# Patient Record
Sex: Female | Born: 1975 | Race: Black or African American | Hispanic: No | Marital: Married | Smoking: Never smoker
Health system: Southern US, Community
[De-identification: ages and names within clinical notes are randomized; demographics above are authoritative.]

---

## 1999-12-09 ENCOUNTER — Ambulatory Visit (HOSPITAL_COMMUNITY): Admission: RE | Admit: 1999-12-09 | Discharge: 1999-12-09 | Payer: Self-pay | Admitting: *Deleted

## 1999-12-09 ENCOUNTER — Encounter: Payer: Self-pay | Admitting: *Deleted

## 2000-03-09 ENCOUNTER — Inpatient Hospital Stay (HOSPITAL_COMMUNITY): Admission: AD | Admit: 2000-03-09 | Discharge: 2000-03-11 | Payer: Self-pay | Admitting: *Deleted

## 2000-08-16 ENCOUNTER — Encounter: Admission: RE | Admit: 2000-08-16 | Discharge: 2000-08-16 | Payer: Self-pay | Admitting: Obstetrics & Gynecology

## 2003-05-14 ENCOUNTER — Emergency Department (HOSPITAL_COMMUNITY): Admission: EM | Admit: 2003-05-14 | Discharge: 2003-05-14 | Payer: Self-pay | Admitting: Emergency Medicine

## 2004-09-24 ENCOUNTER — Ambulatory Visit (HOSPITAL_COMMUNITY): Admission: RE | Admit: 2004-09-24 | Discharge: 2004-09-24 | Payer: Self-pay | Admitting: *Deleted

## 2005-02-06 ENCOUNTER — Inpatient Hospital Stay (HOSPITAL_COMMUNITY): Admission: AD | Admit: 2005-02-06 | Discharge: 2005-02-08 | Payer: Self-pay | Admitting: Obstetrics & Gynecology

## 2005-02-06 ENCOUNTER — Ambulatory Visit: Payer: Self-pay | Admitting: Obstetrics & Gynecology

## 2006-12-22 IMAGING — US US OB COMP +14 WK
1 series · 13 of 28 positions shown · non-contrast
Comparison: none

CLINICAL DATA: Anatomic exam.  No current problems.

[Series 1: us ob comp +14 wk · 0.29mm/px · 13 of 72 slices shown]
[im 3/72]
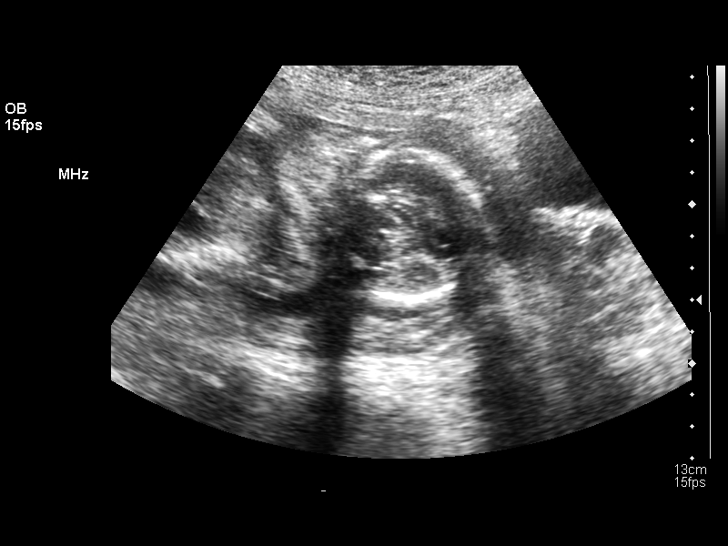
[im 8/72]
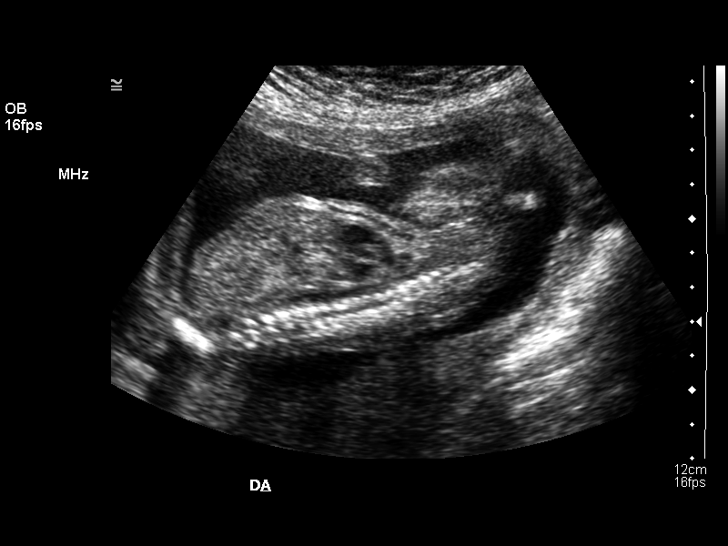
[im 14/72]
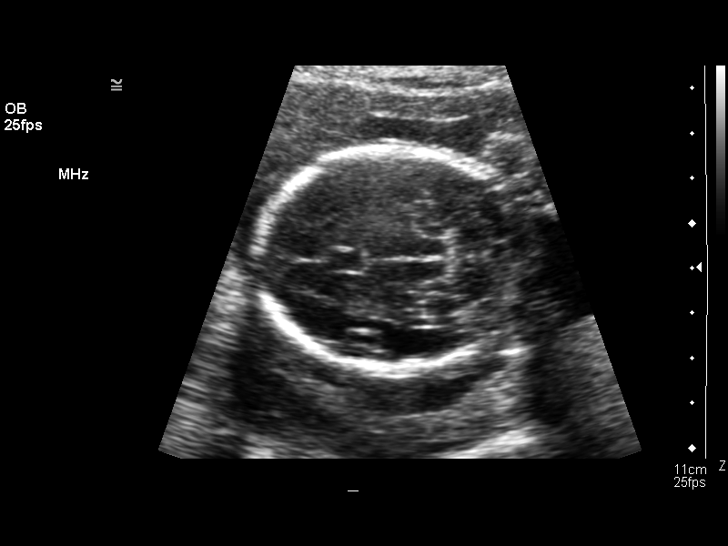
[im 19/72]
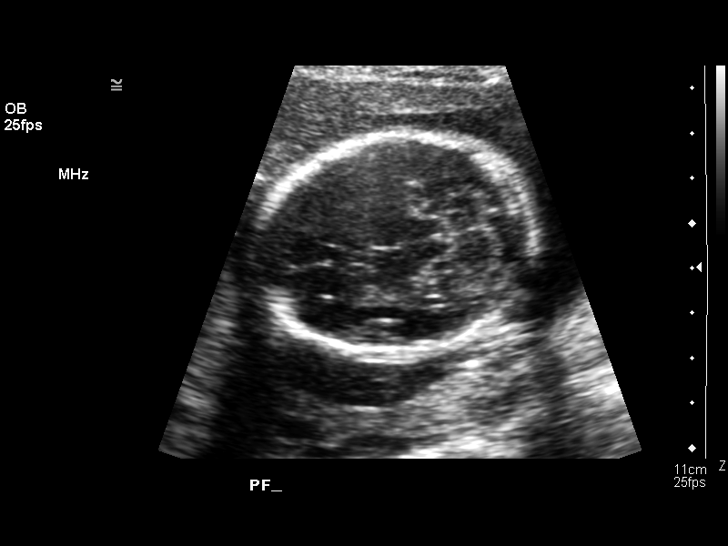
[im 24/72]
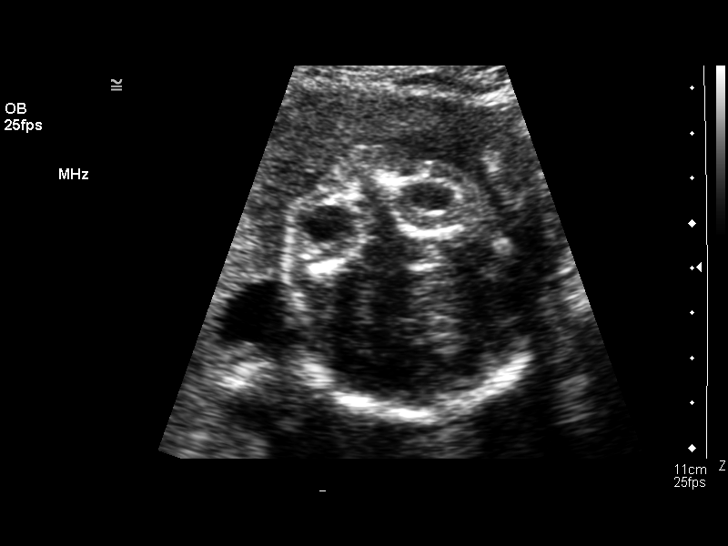
[im 29/72]
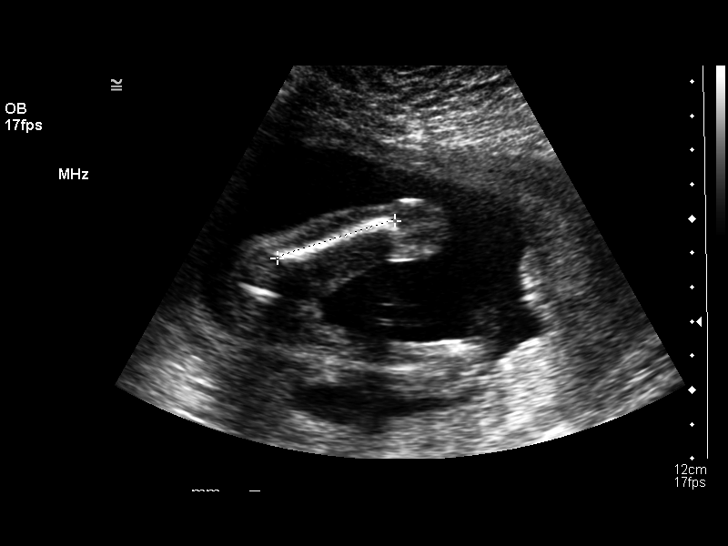
[im 37/72]
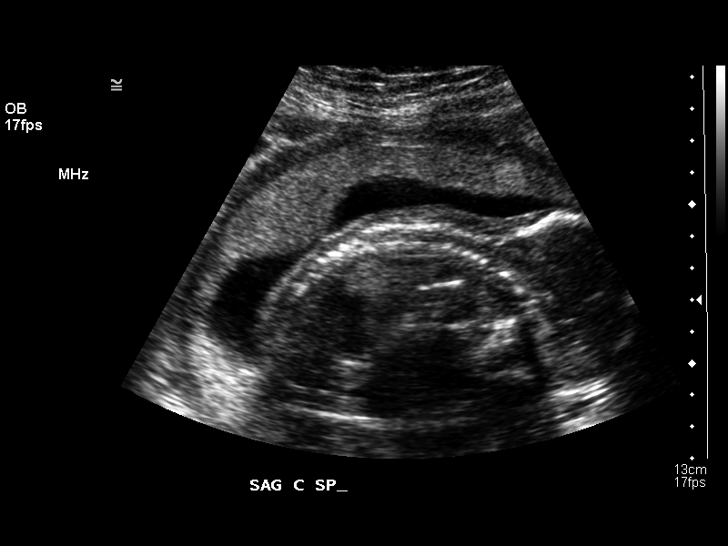
[im 43/72]
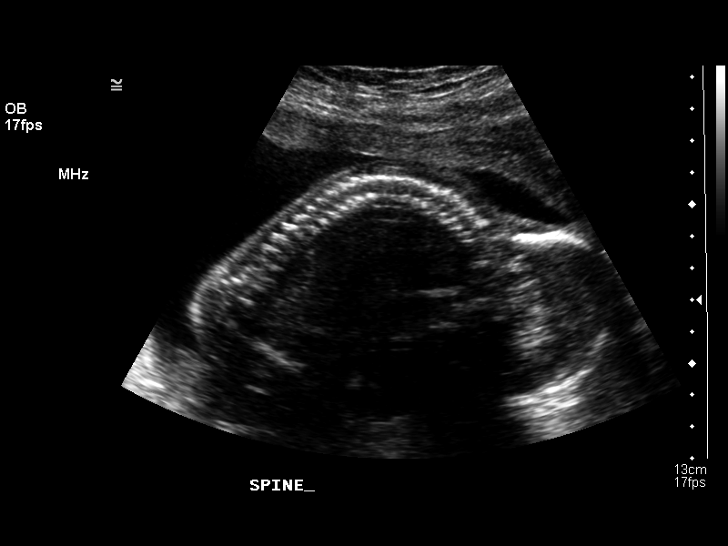
[im 48/72]
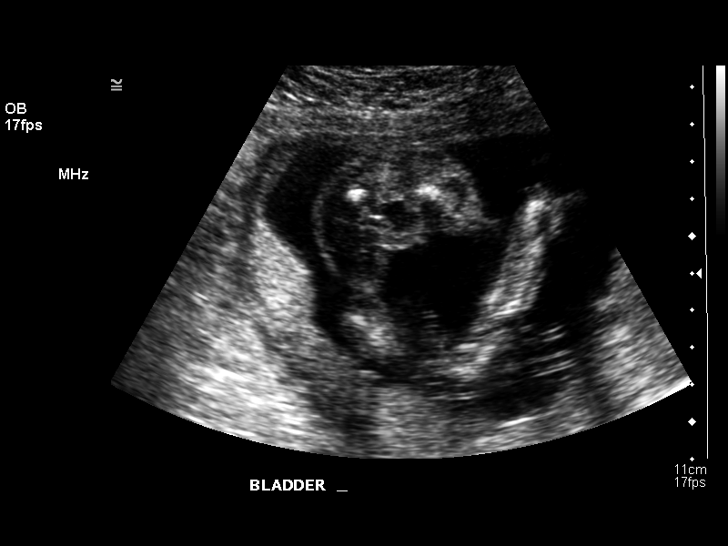
[im 53/72]
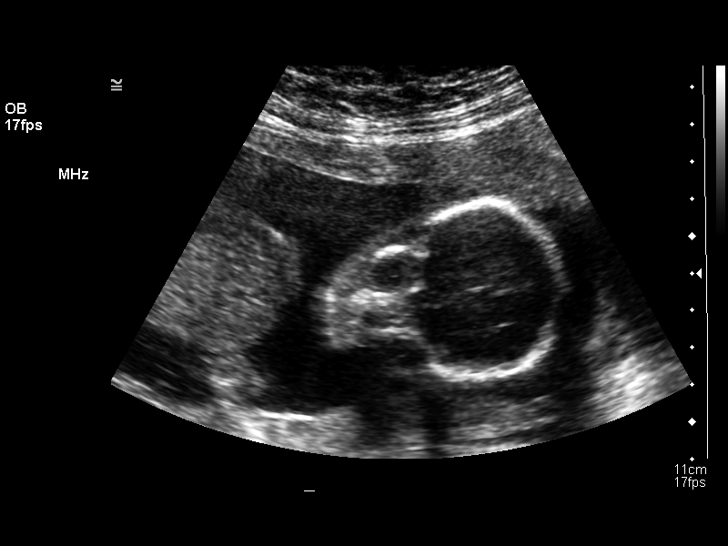
[im 58/72]
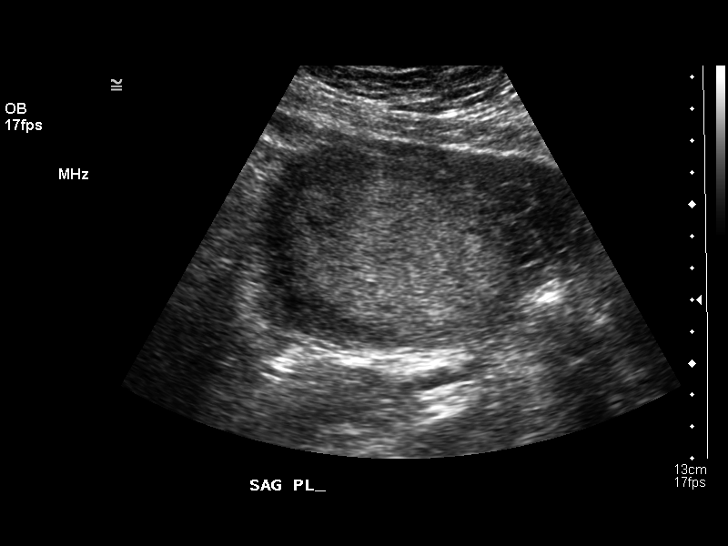
[im 64/72]
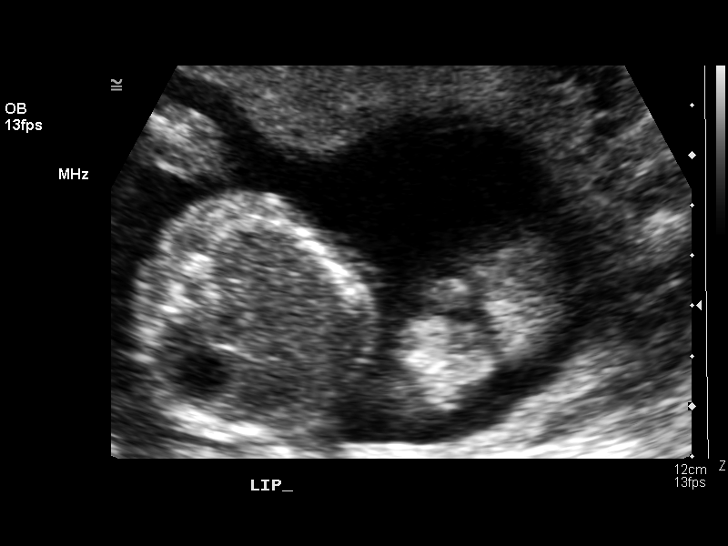
[im 69/72]
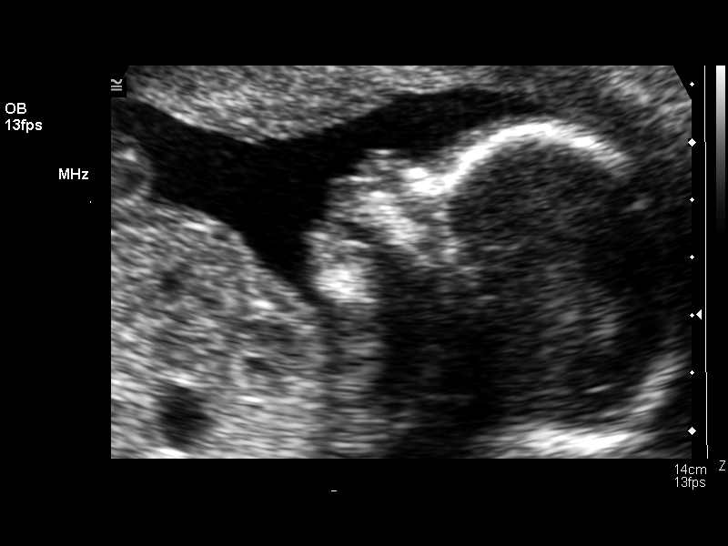

[13 of 28 positions shown; findings below may reference images not displayed]

OBSTETRICAL ULTRASOUND:
Number of Fetuses:  1
Heart Rate:  153
Movement:  Yes
Breathing:  No  
Presentation:  Cephalic
Placental Location:  Posterior
Grade:  I
Previa:  No
Amniotic Fluid (Subjective):  Normal
Amniotic Fluid (Objective):   4.1 cm Vertical pocket 

FETAL BIOMETRY
BPD:  4.9 cm   20 w 6 d
HC:  17.9 cm   20 w 2 d
AC:  15.5 cm  20 w 5 d
FL:     3.5 cm  21 w 1 d

MEAN GA:  20 w 5 d

FETAL ANATOMY
Lateral Ventricles:    Visualized 
Thalami/CSP:      Visualized 
Posterior Fossa:  Visualized 
Nuchal Region:    Visualized 
Spine:      Visualized 
4 Chamber Heart on Left:      Visualized 
Stomach on Left:      Visualized 
3 Vessel Cord:    Visualized 
Cord Insertion site:    Visualized 
Kidneys:  Visualized 
Bladder:  Visualized 
Extremities:      Visualized 

ADDITIONAL ANATOMY VISUALIZED:  LVOT, RVOT, upper lip, orbits, profile, diaphragm, heel, 5th digit, ductal arch, aortic arch, and female genitalia.

MATERNAL UTERINE AND ADNEXAL FINDINGS
Cervix:   4.8 cm Transabdominally
IMPRESSION: 1.  Single intrauterine pregnancy demonstrating an estimated gestational age by ultrasound of 20 weeks and 5 days.  Correlation with assigned gestational age of 20 weeks and 2 days suggests appropriate growth.  
2.  No focal fetal or placental abnormalities are noted with a good anatomic exam possible.

## 2009-06-09 ENCOUNTER — Emergency Department (HOSPITAL_COMMUNITY): Admission: EM | Admit: 2009-06-09 | Discharge: 2009-06-09 | Payer: Self-pay | Admitting: Family Medicine

## 2009-07-07 ENCOUNTER — Ambulatory Visit (HOSPITAL_COMMUNITY): Admission: RE | Admit: 2009-07-07 | Discharge: 2009-07-07 | Payer: Self-pay | Admitting: Obstetrics & Gynecology

## 2009-09-08 ENCOUNTER — Ambulatory Visit (HOSPITAL_COMMUNITY): Admission: RE | Admit: 2009-09-08 | Discharge: 2009-09-08 | Payer: Self-pay | Admitting: Obstetrics & Gynecology

## 2009-09-13 ENCOUNTER — Inpatient Hospital Stay (HOSPITAL_COMMUNITY): Admission: AD | Admit: 2009-09-13 | Discharge: 2009-09-16 | Payer: Self-pay | Admitting: Obstetrics and Gynecology

## 2009-09-13 ENCOUNTER — Ambulatory Visit: Payer: Self-pay | Admitting: Obstetrics and Gynecology

## 2010-05-31 LAB — CBC
HCT: 29.1 % — ABNORMAL LOW (ref 36.0–46.0)
MCHC: 33.9 g/dL (ref 30.0–36.0)
MCHC: 34 g/dL (ref 30.0–36.0)
Platelets: 175 10*3/uL (ref 150–400)
RBC: 3.06 MIL/uL — ABNORMAL LOW (ref 3.87–5.11)
RBC: 3.73 MIL/uL — ABNORMAL LOW (ref 3.87–5.11)
RDW: 14.6 % (ref 11.5–15.5)
WBC: 9.8 10*3/uL (ref 4.0–10.5)

## 2013-05-28 ENCOUNTER — Encounter: Payer: Self-pay | Admitting: Advanced Practice Midwife

## 2013-06-01 ENCOUNTER — Ambulatory Visit: Payer: Self-pay | Admitting: Advanced Practice Midwife

## 2013-06-22 ENCOUNTER — Ambulatory Visit: Payer: Medicaid Other | Admitting: Advanced Practice Midwife

## 2014-01-11 ENCOUNTER — Emergency Department (HOSPITAL_COMMUNITY)
Admission: EM | Admit: 2014-01-11 | Discharge: 2014-01-11 | Disposition: A | Discharge status: Home / Self Care | Payer: Medicaid Other | Attending: Emergency Medicine | Admitting: Emergency Medicine

## 2014-01-11 ENCOUNTER — Emergency Department (HOSPITAL_COMMUNITY): Payer: Medicaid Other

## 2014-01-11 ENCOUNTER — Encounter (HOSPITAL_COMMUNITY): Payer: Self-pay | Admitting: Emergency Medicine

## 2014-01-11 DIAGNOSIS — W1840XA Slipping, tripping and stumbling without falling, unspecified, initial encounter: Secondary | ICD-10-CM | POA: Diagnosis not present

## 2014-01-11 DIAGNOSIS — S99919A Unspecified injury of unspecified ankle, initial encounter: Secondary | ICD-10-CM | POA: Diagnosis present

## 2014-01-11 DIAGNOSIS — Y9289 Other specified places as the place of occurrence of the external cause: Secondary | ICD-10-CM | POA: Diagnosis not present

## 2014-01-11 DIAGNOSIS — S93402A Sprain of unspecified ligament of left ankle, initial encounter: Secondary | ICD-10-CM | POA: Insufficient documentation

## 2014-01-11 DIAGNOSIS — Y9389 Activity, other specified: Secondary | ICD-10-CM | POA: Diagnosis not present

## 2014-01-11 DIAGNOSIS — Z791 Long term (current) use of non-steroidal anti-inflammatories (NSAID): Secondary | ICD-10-CM | POA: Diagnosis not present

## 2014-01-11 MED ORDER — HYDROCODONE-ACETAMINOPHEN 5-325 MG PO TABS: 1.0000 | ORAL_TABLET | Freq: Four times a day (QID) | ORAL | Status: AC | PRN

## 2014-01-11 MED ORDER — IBUPROFEN 800 MG PO TABS: 800.0000 mg | ORAL_TABLET | Freq: Three times a day (TID) | ORAL | Status: AC

## 2014-01-11 NOTE — ED Notes (Signed)
Patient transported to X-ray 

## 2014-01-11 NOTE — ED Notes (Signed)
Pt  Complaining of left ankle pain after slip on the deck this am.

## 2014-01-11 NOTE — ED Provider Notes (Signed)
CSN: [id number]     Arrival date & time 01/11/14  [id number] History  This chart was scribed for non-physician practitioner, Ivar Drapeob Luellen Howson, PA-C,working with Flint MelterElliott L Wentz, MD, by Karle PlumberJennifer Tensley, ED Scribe. This patient was seen in room TR06C/TR06C and the patient's care was started at 9:17 AM.  Chief Complaint  Patient presents with  . Ankle Pain   Patient is a 38 y.o. female presenting with ankle pain. The history is provided by the patient. No language interpreter was used.  Ankle Pain  HPI Comments:  Haley Gentry is a 38 y.o. female who presents to the Emergency Department complaining of slipping and twisting the left ankle a few hours ago. Pt reports associated swelling and moderate pain. She has iced the ankle but has not taken anything for pain. She reports she has been able to ambulate with a limp and pain. Movement of the ankle makes the pain worse. She does not report any alleviating factors. Denies numbness, tingling, knee injury or pain or bruising.  History reviewed. No pertinent past medical history. History reviewed. No pertinent past surgical history. History reviewed. No pertinent family history. History  Substance Use Topics  . Smoking status: Never Smoker   . Smokeless tobacco: Not on file  . Alcohol Use: No   OB History   Grav Para Term Preterm Abortions TAB SAB Ect Mult Living                 Review of Systems  Musculoskeletal: Positive for joint swelling.  Skin: Negative for color change and wound.  Neurological: Negative for numbness.    Allergies  Review of patient's allergies indicates no known allergies.  Home Medications   Prior to Admission medications   Medication Sig Start Date End Date Taking? Authorizing Provider  HYDROcodone-acetaminophen (NORCO/VICODIN) 5-325 MG per tablet Take 1-2 tablets by mouth every 6 (six) hours as needed. 01/11/14   Roxy Horsemanobert Jasmynn Pfalzgraf, PA-C  ibuprofen (ADVIL,MOTRIN) 800 MG tablet Take 1 tablet (800 mg total) by mouth 3  (three) times daily. 01/11/14   Roxy Horsemanobert Tamyka Bezio, PA-C   Triage Vitals: BP 122/74  Pulse 65  Temp(Src) 97.5 F (36.4 C) (Oral)  Resp 18  Ht 5\' 6"  (1.676 m)  SpO2 100%  LMP 01/07/2014 Physical Exam  Nursing note and vitals reviewed. Constitutional: She is oriented to person, place, and time. She appears well-developed and well-nourished.  HENT:  Head: Normocephalic and atraumatic.  Eyes: EOM are normal.  Neck: Normal range of motion.  Cardiovascular: Normal rate.   Intact distal pulses with brisk capillary refill.  Pulmonary/Chest: Effort normal.  Musculoskeletal: Normal range of motion. She exhibits edema.  Left ankle moderately swollen. Tender to palpation over medial and lateral aspects. ROM and strength limited secondary to pain. No obvious bony abnormality or deformity.  Neurological: She is alert and oriented to person, place, and time.  Sensation intact.  Skin: Skin is warm and dry.  Psychiatric: She has a normal mood and affect. Her behavior is normal.    ED Course  Procedures (including critical care time) DIAGNOSTIC STUDIES: Oxygen Saturation is 100% on RA, normal by my interpretation.   COORDINATION OF CARE: 9:19 AM- Will X-Ray left ankle. Pt verbalizes understanding and agrees to plan.  Medications - No data to display  Labs Review Labs Reviewed - No data to display  Imaging Review Dg Ankle Complete Left  01/11/2014   CLINICAL DATA:  Initial encounter for ankle injury with pain after slipping on a wet deck earlier  today.  EXAM: LEFT ANKLE COMPLETE - 3+ VIEW  COMPARISON:  None.  FINDINGS: No evidence for fracture. No subluxation or dislocation. Ankle mortise is preserved. Lateral soft tissue swelling is evident.  IMPRESSION: No acute bony findings.   Electronically Signed   By: Kennith CenterEric  Mansell M.D.   On: 01/11/2014 10:02     EKG Interpretation None      MDM   Final diagnoses:  Ankle sprain, left, initial encounter   Patient with ankle sprain.  Treat  with crutches and ankle ASO.  Ortho follow-up.  Pain meds and NSAIDs.  I personally performed the services described in this documentation, which was scribed in my presence. The recorded information has been reviewed and is accurate.     Roxy Horsemanobert Tenzin Edelman, PA-C 01/11/14 1034

## 2014-01-11 NOTE — ED Provider Notes (Signed)
Medical screening examination/treatment/procedure(s) were performed by non-physician practitioner and as supervising physician I was immediately available for consultation/collaboration.  Rafeal Skibicki L Aidenjames Heckmann, MD 01/11/14 1544 

## 2014-01-11 NOTE — Discharge Instructions (Signed)

## 2016-04-09 IMAGING — CR DG ANKLE COMPLETE 3+V*L*
3 series · 3 of 3 positions shown · non-contrast
Comparison: None.

CLINICAL DATA: Initial encounter for ankle injury with pain after
slipping on a wet deck earlier today.

EXAM:
LEFT ANKLE COMPLETE - 3+ VIEW

[x ankle ap left]
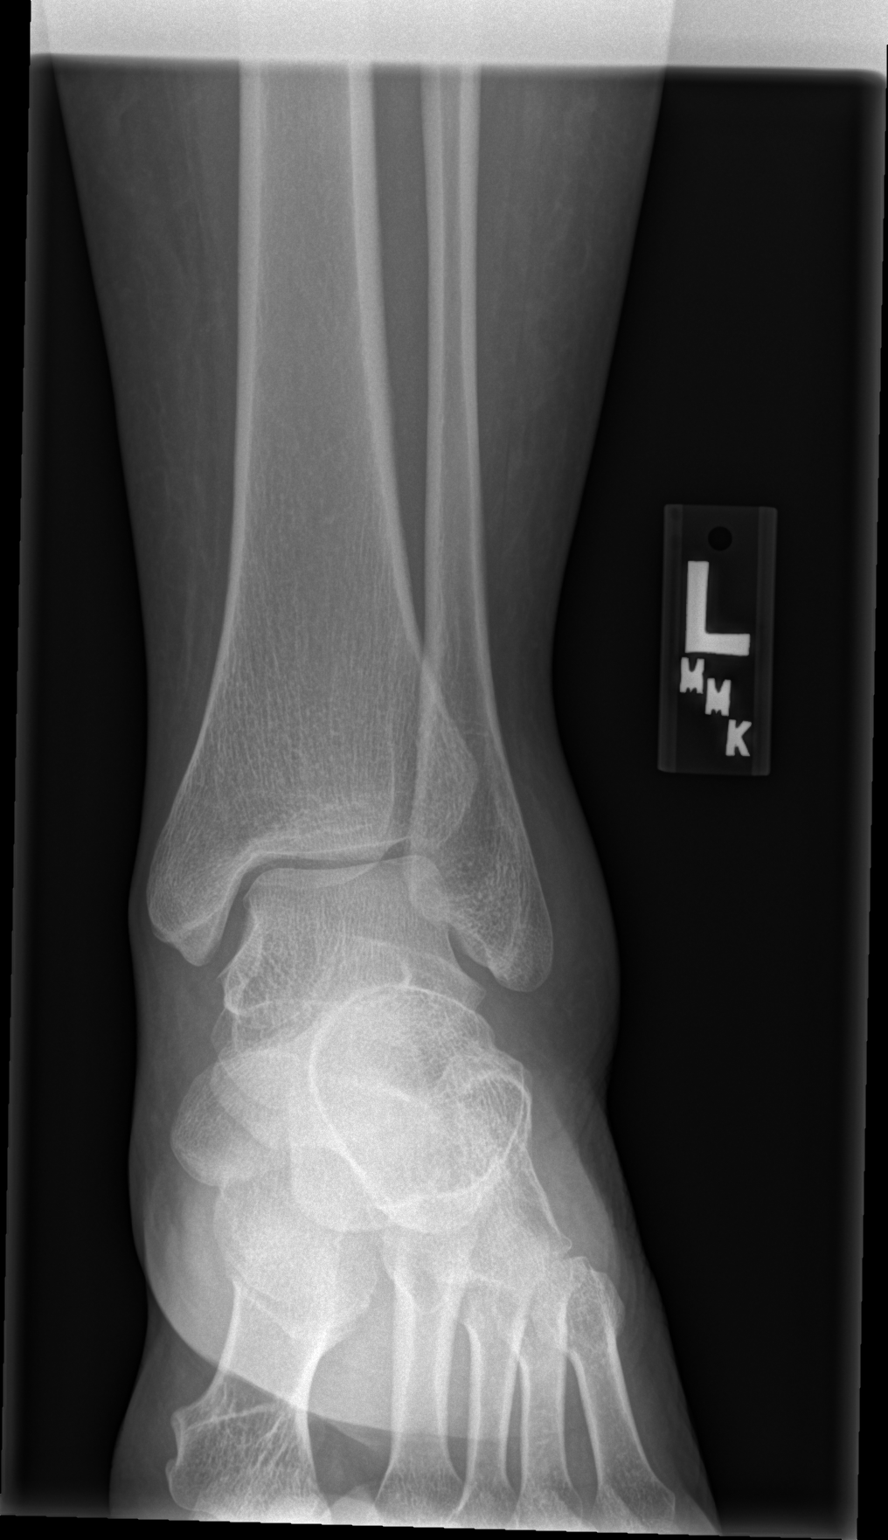

[x ankle obl left]
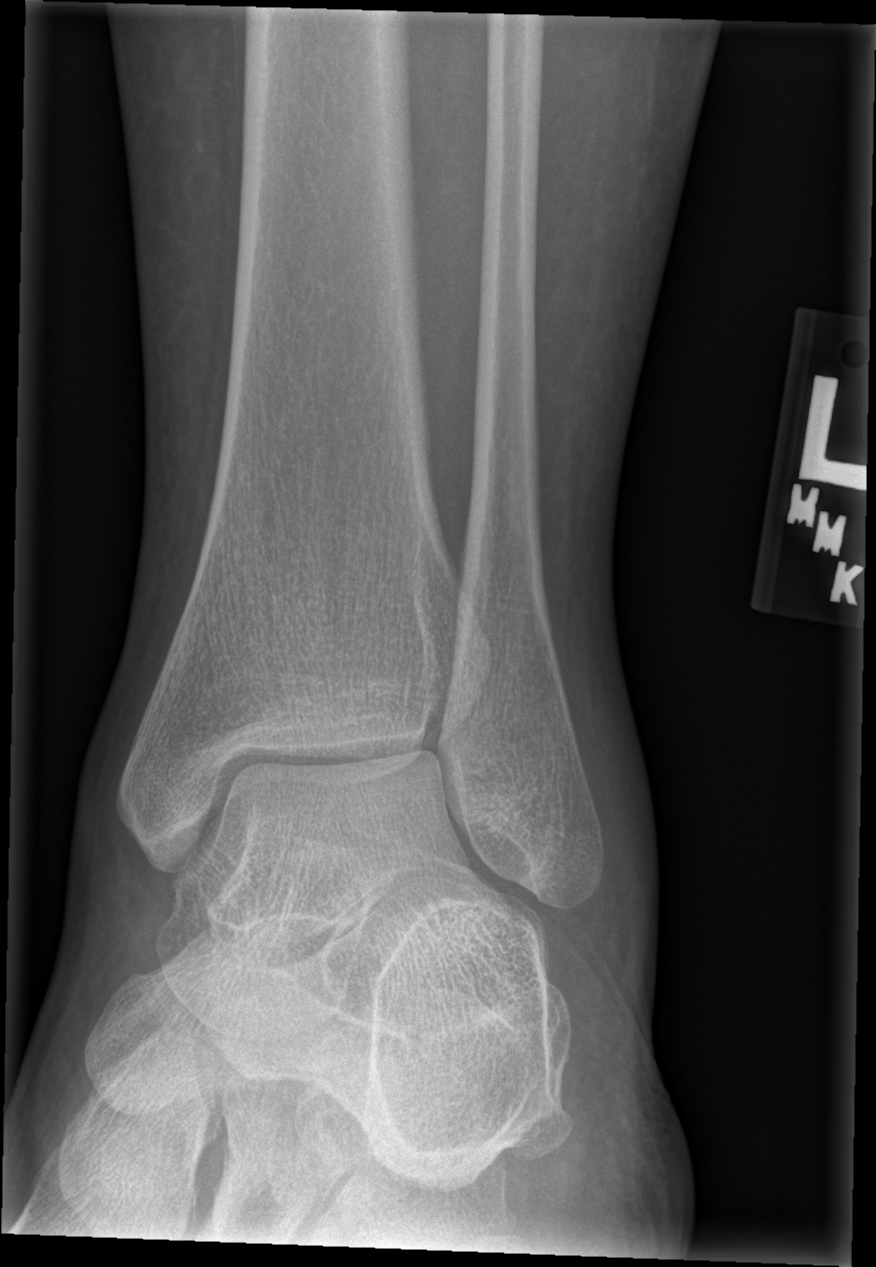

[x ankle lat left]
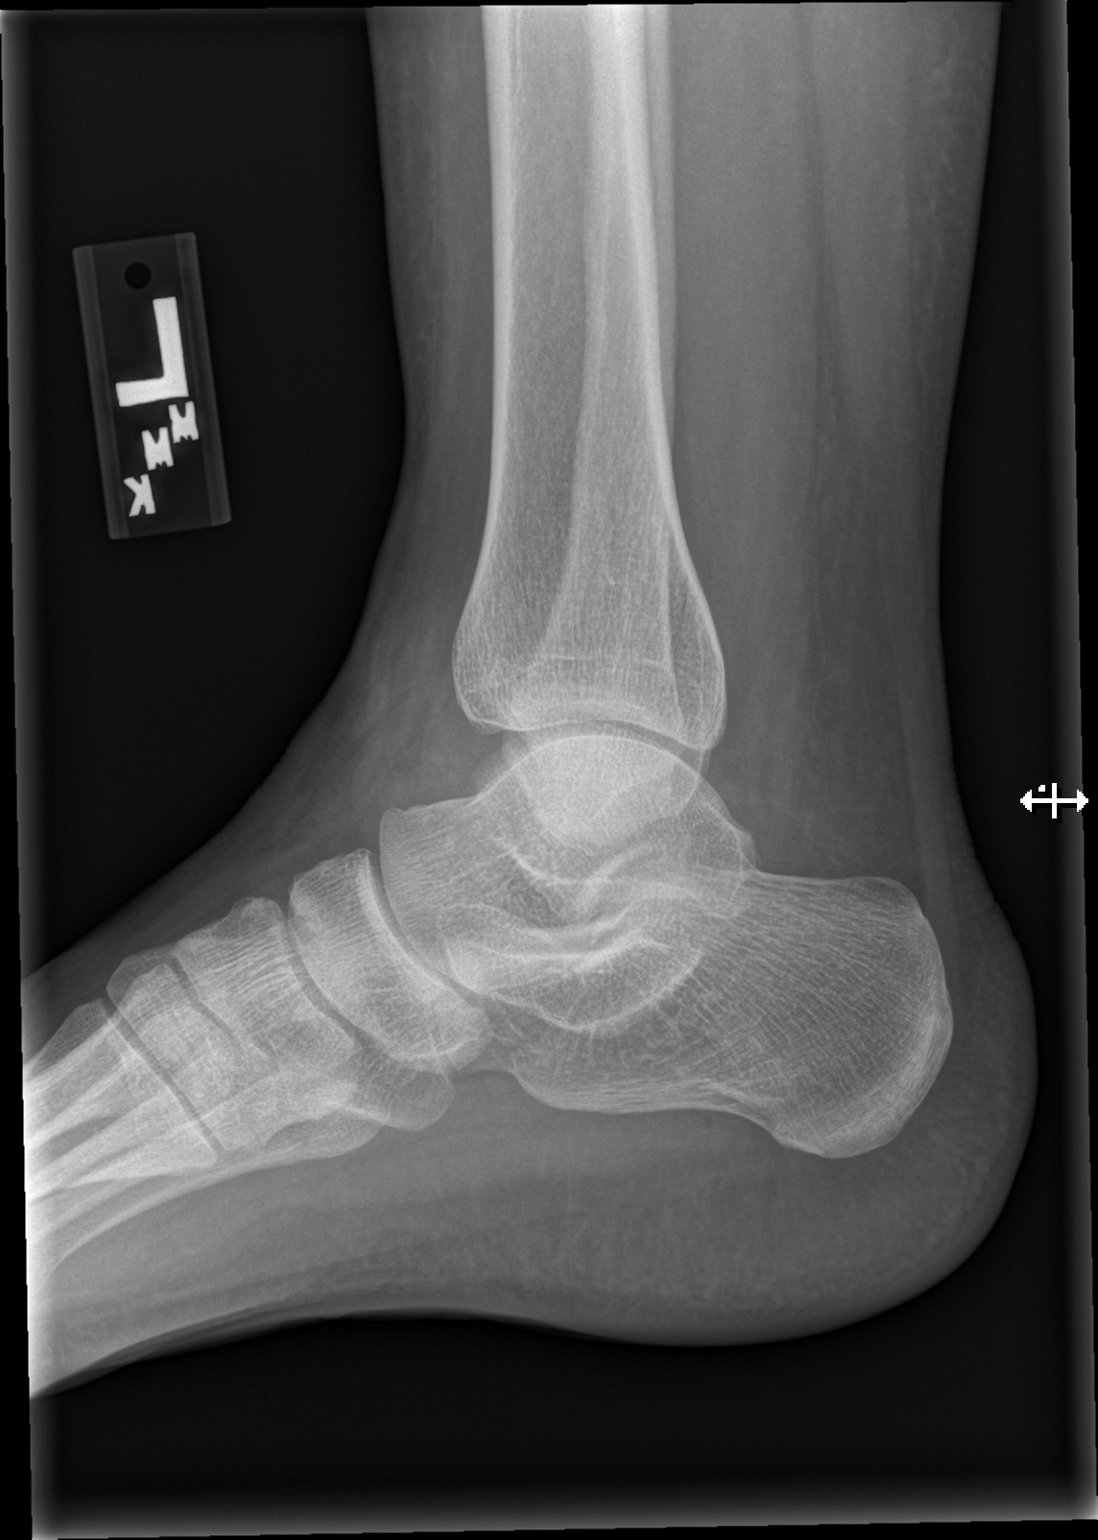

[3 of 3 positions shown; findings below may reference images not displayed]

FINDINGS: No evidence for fracture. No subluxation or dislocation. Ankle
mortise is preserved. Lateral soft tissue swelling is evident.
IMPRESSION: No acute bony findings.

## 2020-06-09 ENCOUNTER — Other Ambulatory Visit: Payer: Self-pay | Admitting: Obstetrics and Gynecology

## 2020-06-09 DIAGNOSIS — Z1231 Encounter for screening mammogram for malignant neoplasm of breast: Secondary | ICD-10-CM

## 2020-07-01 ENCOUNTER — Ambulatory Visit: Payer: Self-pay
